# Patient Record
Sex: Female | Born: 1980 | Race: White | Hispanic: No | Marital: Married | Smoking: Never smoker
Health system: Southern US, Community
[De-identification: ages and names within clinical notes are randomized; demographics above are authoritative.]

## PROBLEM LIST (undated history)

## (undated) DIAGNOSIS — M1990 Unspecified osteoarthritis, unspecified site: Secondary | ICD-10-CM

## (undated) DIAGNOSIS — I10 Essential (primary) hypertension: Secondary | ICD-10-CM

## (undated) HISTORY — PX: COSMETIC SURGERY: SHX468

---

## 2012-08-18 HISTORY — PX: COSMETIC SURGERY: SHX468

## 2015-09-19 DIAGNOSIS — J189 Pneumonia, unspecified organism: Secondary | ICD-10-CM

## 2015-09-19 HISTORY — DX: Pneumonia, unspecified organism: J18.9

## 2017-01-23 ENCOUNTER — Emergency Department (HOSPITAL_COMMUNITY): Payer: Managed Care, Other (non HMO)

## 2017-01-23 ENCOUNTER — Encounter (HOSPITAL_COMMUNITY): Payer: Self-pay | Admitting: Emergency Medicine

## 2017-01-23 ENCOUNTER — Emergency Department (HOSPITAL_COMMUNITY)
Admission: EM | Admit: 2017-01-23 | Discharge: 2017-01-23 | Disposition: A | Discharge status: Home / Self Care | Payer: Managed Care, Other (non HMO) | Attending: Emergency Medicine | Admitting: Emergency Medicine

## 2017-01-23 DIAGNOSIS — R109 Unspecified abdominal pain: Secondary | ICD-10-CM | POA: Insufficient documentation

## 2017-01-23 DIAGNOSIS — Y939 Activity, unspecified: Secondary | ICD-10-CM | POA: Insufficient documentation

## 2017-01-23 DIAGNOSIS — S299XXA Unspecified injury of thorax, initial encounter: Secondary | ICD-10-CM | POA: Diagnosis present

## 2017-01-23 DIAGNOSIS — S2242XA Multiple fractures of ribs, left side, initial encounter for closed fracture: Secondary | ICD-10-CM | POA: Insufficient documentation

## 2017-01-23 DIAGNOSIS — Y92009 Unspecified place in unspecified non-institutional (private) residence as the place of occurrence of the external cause: Secondary | ICD-10-CM | POA: Diagnosis not present

## 2017-01-23 DIAGNOSIS — W1789XA Other fall from one level to another, initial encounter: Secondary | ICD-10-CM | POA: Diagnosis not present

## 2017-01-23 DIAGNOSIS — S301XXA Contusion of abdominal wall, initial encounter: Secondary | ICD-10-CM | POA: Insufficient documentation

## 2017-01-23 DIAGNOSIS — Y999 Unspecified external cause status: Secondary | ICD-10-CM | POA: Diagnosis not present

## 2017-01-23 DIAGNOSIS — W19XXXA Unspecified fall, initial encounter: Secondary | ICD-10-CM

## 2017-01-23 LAB — CBC WITH DIFFERENTIAL/PLATELET
Basophils Absolute: 0.1 10*3/uL (ref 0.0–0.1)
Basophils Relative: 1 %
EOS ABS: 0.1 10*3/uL (ref 0.0–0.7)
EOS PCT: 1 %
HCT: 38.3 % (ref 36.0–46.0)
Hemoglobin: 12.5 g/dL (ref 12.0–15.0)
LYMPHS ABS: 1.5 10*3/uL (ref 0.7–4.0)
Lymphocytes Relative: 16 %
MCH: 30 pg (ref 26.0–34.0)
MCHC: 32.6 g/dL (ref 30.0–36.0)
MCV: 91.8 fL (ref 78.0–100.0)
Monocytes Absolute: 0.5 10*3/uL (ref 0.1–1.0)
Monocytes Relative: 6 %
Neutro Abs: 7.5 10*3/uL (ref 1.7–7.7)
Neutrophils Relative %: 76 %
PLATELETS: 255 10*3/uL (ref 150–400)
RBC: 4.17 MIL/uL (ref 3.87–5.11)
RDW: 12.5 % (ref 11.5–15.5)
WBC: 9.7 10*3/uL (ref 4.0–10.5)

## 2017-01-23 LAB — COMPREHENSIVE METABOLIC PANEL
ALT: 18 U/L (ref 14–54)
ANION GAP: 9 (ref 5–15)
AST: 25 U/L (ref 15–41)
Albumin: 4.3 g/dL (ref 3.5–5.0)
Alkaline Phosphatase: 33 U/L — ABNORMAL LOW (ref 38–126)
BUN: 14 mg/dL (ref 6–20)
CHLORIDE: 105 mmol/L (ref 101–111)
CO2: 25 mmol/L (ref 22–32)
CREATININE: 0.84 mg/dL (ref 0.44–1.00)
Calcium: 9.4 mg/dL (ref 8.9–10.3)
GFR calc non Af Amer: >60 mL/min (ref >60)
Glucose, Bld: 104 mg/dL — ABNORMAL HIGH (ref 65–99)
Potassium: 4 mmol/L (ref 3.5–5.1)
SODIUM: 139 mmol/L (ref 135–145)
Total Bilirubin: 1.1 mg/dL (ref 0.3–1.2)
Total Protein: 6.9 g/dL (ref 6.5–8.1)

## 2017-01-23 LAB — I-STAT BETA HCG BLOOD, ED (MC, WL, AP ONLY): I-stat hCG, quantitative: <5 m[IU]/mL (ref <5)

## 2017-01-23 MED ORDER — ONDANSETRON HCL 4 MG/2ML IJ SOLN
4.0000 mg | Freq: Once | INTRAMUSCULAR | Status: AC
  Administered 2017-01-23: 4 mg via INTRAVENOUS
  Filled 2017-01-23: qty 2

## 2017-01-23 MED ORDER — MORPHINE SULFATE (PF) 4 MG/ML IV SOLN
4.0000 mg | Freq: Once | INTRAVENOUS | Status: AC
Start: 2017-01-23 — End: 2017-01-23
  Administered 2017-01-23: 4 mg via INTRAVENOUS
  Filled 2017-01-23: qty 1

## 2017-01-23 MED ORDER — OXYCODONE-ACETAMINOPHEN 5-325 MG PO TABS
1.0000 | ORAL_TABLET | ORAL | Status: DC | PRN
  Administered 2017-01-23: 1 via ORAL
  Filled 2017-01-23: qty 1

## 2017-01-23 MED ORDER — OXYCODONE-ACETAMINOPHEN 5-325 MG PO TABS
ORAL_TABLET | ORAL | Status: AC
  Filled 2017-01-23: qty 1

## 2017-01-23 MED ORDER — IOPAMIDOL (ISOVUE-300) INJECTION 61%
INTRAVENOUS | Status: AC
  Administered 2017-01-23: 100 mL
  Filled 2017-01-23: qty 100

## 2017-01-23 MED ORDER — HYDROCODONE-ACETAMINOPHEN 5-325 MG PO TABS: 1.0000 | ORAL_TABLET | Freq: Four times a day (QID) | ORAL | 0 refills | Status: AC | PRN

## 2017-01-23 NOTE — ED Provider Notes (Signed)
[facility]PT Provider Note   CSN: [id number] Arrival date & time: 01/23/17  1743     History   Chief Complaint Chief Complaint  Patient presents with  . Fall    HPI Greer PickerelKelly Ealy is a 36 y.o. female.  Patient is a 36 year old female who presents with complaints of left flank pain. She was getting something off of a shelf in the attic of her house when she stepped between the floor joists and fell partially through the ceiling. She landed on her left flank on one of the floor joists. She is complaining of pain in her left flank. She was initially evaluated at urgent care, then sent here for further evaluation. She denies any hematuria.   The history is provided by the patient.  Fall  This is a new problem. The current episode started 1 to 2 hours ago. The problem occurs constantly. The problem has not changed since onset.Associated symptoms include abdominal pain. Pertinent negatives include no chest pain. Exacerbated by: Movement and palpation. Nothing relieves the symptoms. Treatments tried: Percocet. The treatment provided no relief.    History reviewed. No pertinent past medical history.  There are no active problems to display for this patient.   History reviewed. No pertinent surgical history.  OB History    Gravida Para Term Preterm AB Living   1             SAB TAB Ectopic Multiple Live Births                   Home Medications    Prior to Admission medications   Not on File    Family History No family history on file.  Social History Social History  Substance Use Topics  . Smoking status: Never Smoker  . Smokeless tobacco: Never Used  . Alcohol use No     Allergies   Patient has no allergy information on record.   Review of Systems Review of Systems  Cardiovascular: Negative for chest pain.  Gastrointestinal: Positive for abdominal pain.  All other systems reviewed and are negative.    Physical Exam Updated Vital Signs BP (!) 137/93  (BP Location: Left Arm)   Pulse (!) 108   Temp 98.7 F (37.1 C) (Oral)   Resp 18   Ht 5\' 5"  (1.651 m)   Wt 112 lb (50.8 kg)   LMP 01/08/2017 (Approximate)   SpO2 100%   BMI 18.64 kg/m   Physical Exam  Constitutional: She is oriented to person, place, and time. She appears well-developed and well-nourished. No distress.  HENT:  Head: Normocephalic and atraumatic.  Neck: Normal range of motion. Neck supple.  Cardiovascular: Normal rate and regular rhythm.  Exam reveals no gallop and no friction rub.   No murmur heard. Pulmonary/Chest: Effort normal and breath sounds normal. No respiratory distress. She has no wheezes.  Breath sounds are clear and equal.  Abdominal: Soft. Bowel sounds are normal. She exhibits no distension. There is no tenderness.  There is a contusion/abrasion to the left flank. There is no tenderness in the lumbar or thoracic spine.   Musculoskeletal: Normal range of motion.  Neurological: She is alert and oriented to person, place, and time.  Skin: Skin is warm and dry. She is not diaphoretic.  Nursing note and vitals reviewed.    ED Treatments / Results  Labs (all labs ordered are listed, but only abnormal results are displayed) Labs Reviewed  COMPREHENSIVE METABOLIC PANEL - Abnormal; Notable for the following:  Result Value   Glucose, Bld 104 (*)    Alkaline Phosphatase 33 (*)    All other components within normal limits  CBC WITH DIFFERENTIAL/PLATELET  URINALYSIS, ROUTINE W REFLEX MICROSCOPIC  HCG, SERUM, QUALITATIVE    EKG  EKG Interpretation None       Radiology No results found.  Procedures Procedures (including critical care time)  Medications Ordered in ED Medications  oxyCODONE-acetaminophen (PERCOCET/ROXICET) 5-325 MG per tablet 1 tablet (1 tablet Oral Given 01/23/17 1926)  morphine 4 MG/ML injection 4 mg (not administered)     Initial Impression / Assessment and Plan / ED Course  I have reviewed the triage vital signs and  the nursing notes.  Pertinent labs & imaging results that were available during my care of the patient were reviewed by me and considered in my medical decision making (see chart for details).  Patient sent from urgent care for evaluation after a fall through an attic floor joist. She is complaining of pain in her left flank. CT scan shows no evidence for internal injury to the kidney or spleen. It does however show what appears to be nondisplaced fractures of the 10th and 11th ribs. This will be treated with pain medicine and follow-up as needed.  Final Clinical Impressions(s) / ED Diagnoses   Final diagnoses:  None    New Prescriptions New Prescriptions   No medications on file     Geoffery Lyons, MD 01/23/17 2322

## 2017-01-23 NOTE — ED Notes (Signed)
Pt left POV with family members, in stable condition. Left with discharge instructions and prescriptions in hand.

## 2017-01-23 NOTE — ED Triage Notes (Signed)
[facility] falling through attic of home. Pt did not fall through completely, one foot fell through and patient landed on left abdomen/side. Pt [facility] and did not show any rib fractures however provider was concerned for patients spleen as she is tender to LUQ of abdomen and left axilla.

## 2017-01-23 NOTE — Discharge Instructions (Signed)
Hydrocodone as prescribed as needed for pain.  Return to the emergency department for difficulty breathing, high fevers, productive cough, or other new and concerning symptoms.

## 2018-03-15 IMAGING — CT CT ABD-PELV W/ CM
2 of 5 series · 16 of 46 positions shown, 18 images · IV contrast (APPLIED)
Comparison: None.

CLINICAL DATA: 35 y/o  F; left upper quadrant pain after fall.

EXAM:
CT ABDOMEN AND PELVIS WITH CONTRAST
TECHNIQUE: Multidetector CT imaging of the abdomen and pelvis was performed
using the standard protocol following bolus administration of
intravenous contrast.
CONTRAST:  100mL 3MFZA2-QDD IOPAMIDOL (3MFZA2-QDD) INJECTION 61%

[Series 3: abd/ pelvis 5.0 i30f 2 · axial · 0.69mm/px · z∈[+734,+1139]mm · 13 of 91 slices shown, 15 images]
[im 5/91  soft-tissue]
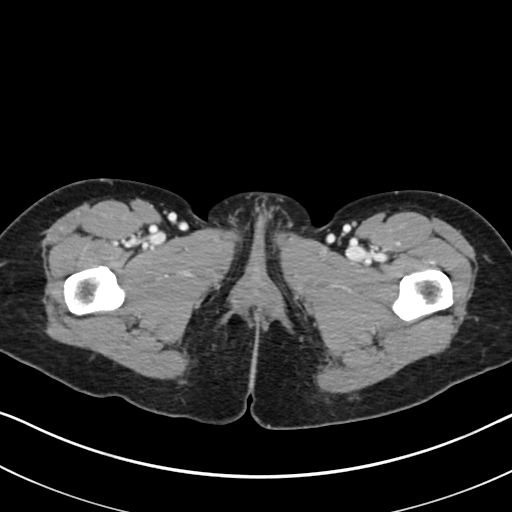
[im 5/91  bone]
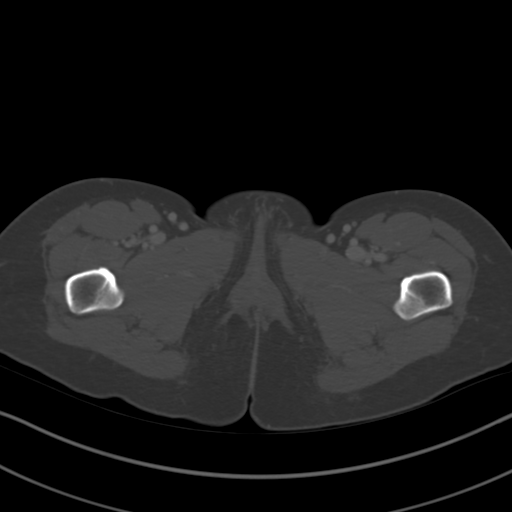
[im 14/91  soft-tissue]
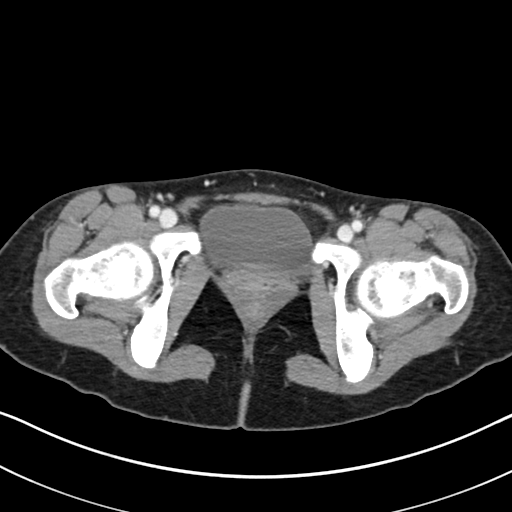
[im 19/91  soft-tissue]
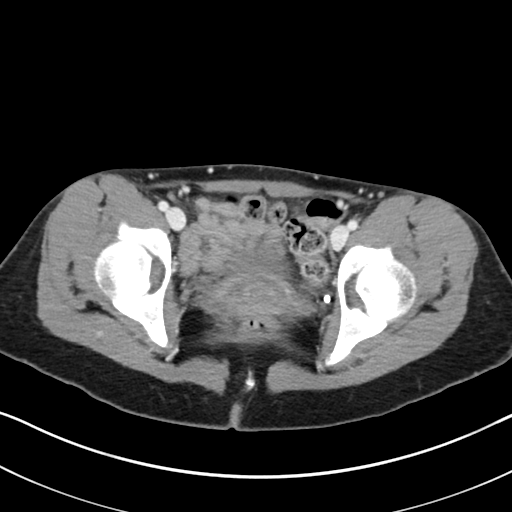
[im 28/91  soft-tissue]
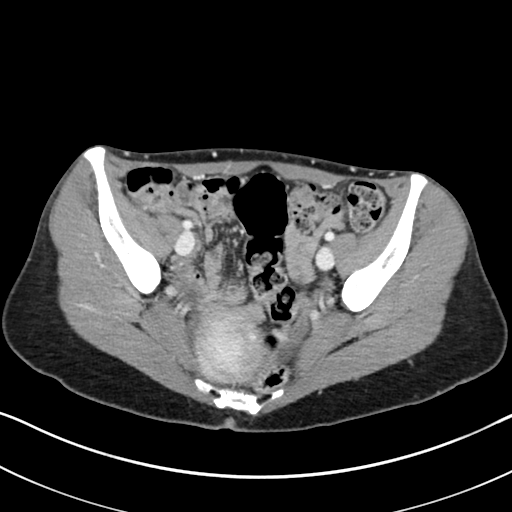
[im 32/91  soft-tissue]
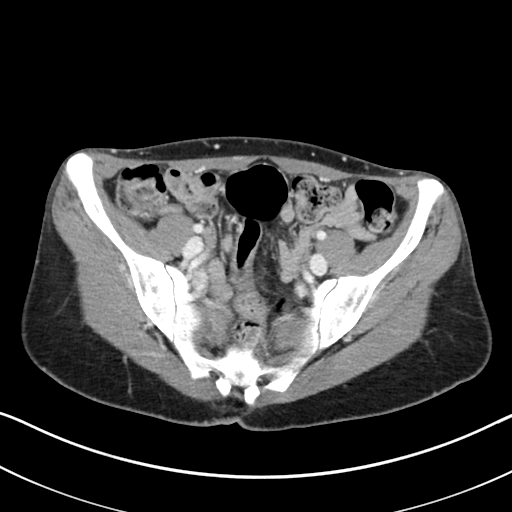
[im 41/91  soft-tissue]
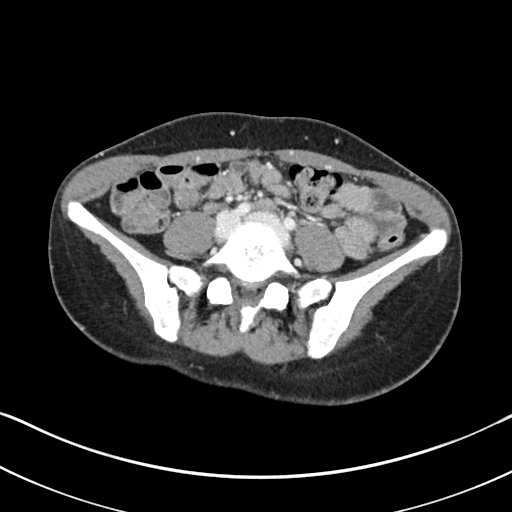
[im 46/91  soft-tissue]
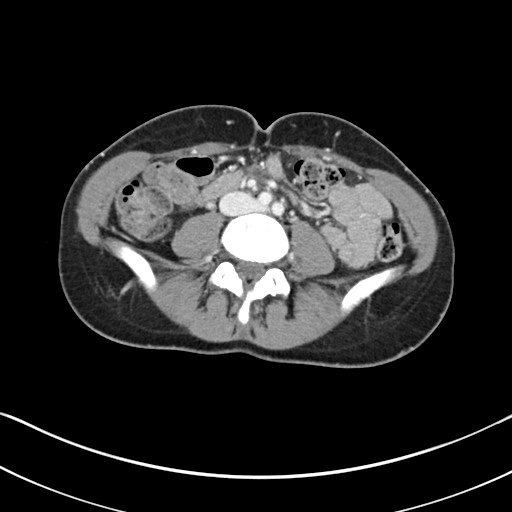
[im 50/91  soft-tissue]
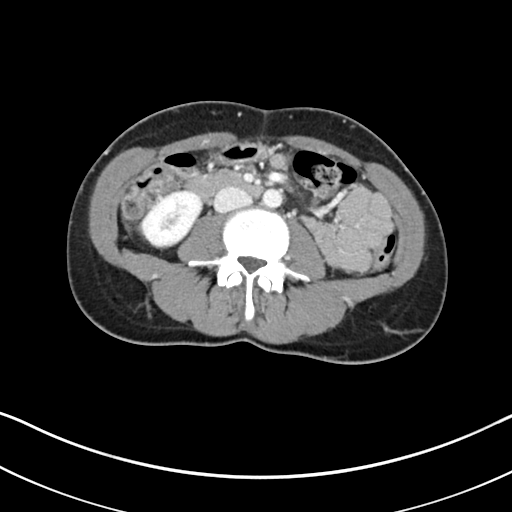
[im 59/91  soft-tissue]
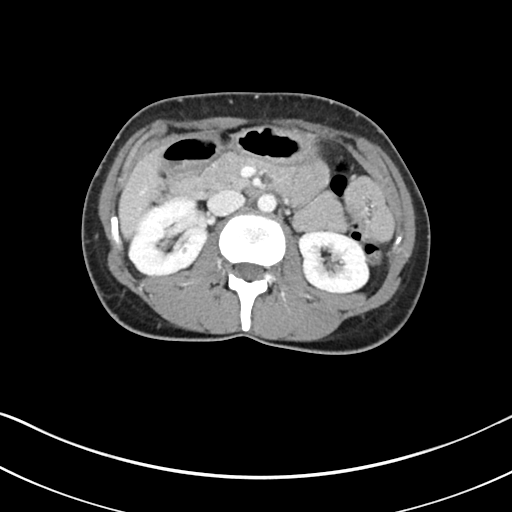
[im 59/91  bone]
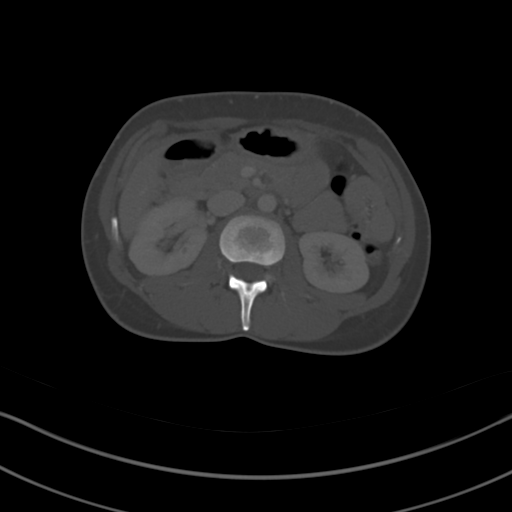
[im 64/91  soft-tissue]
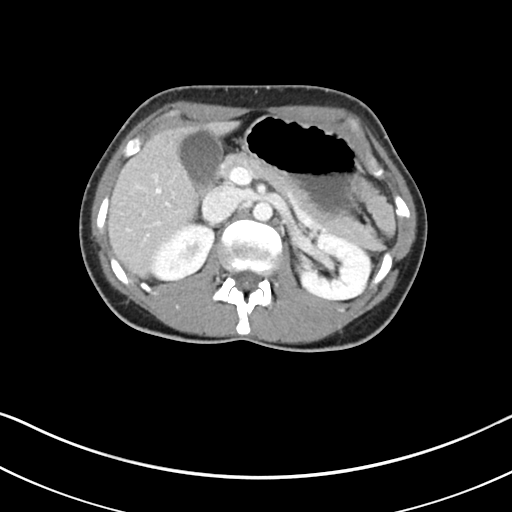
[im 73/91  soft-tissue]
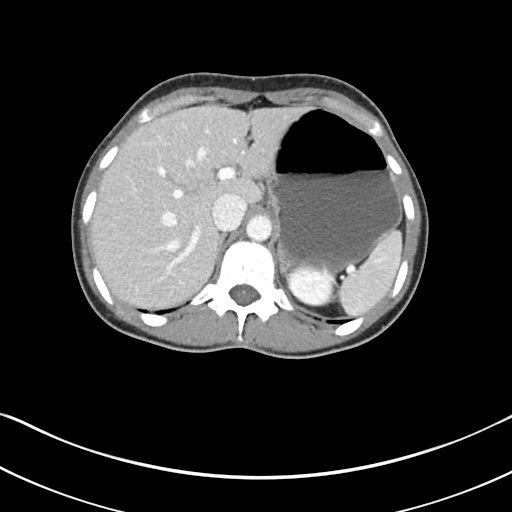
[im 77/91  soft-tissue]
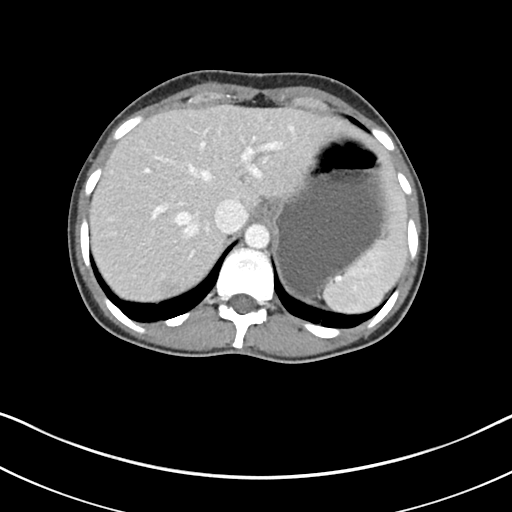
[im 86/91  soft-tissue]
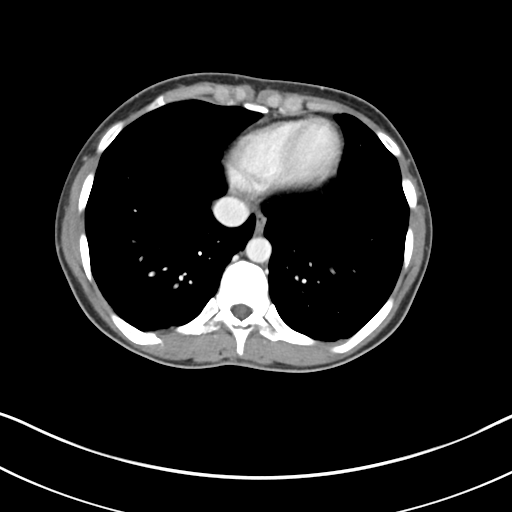

[Series 6: coronal soft tissue · coronal · 0.70mm/px · 3 of 84 slices shown]
[im 28/84  soft-tissue]
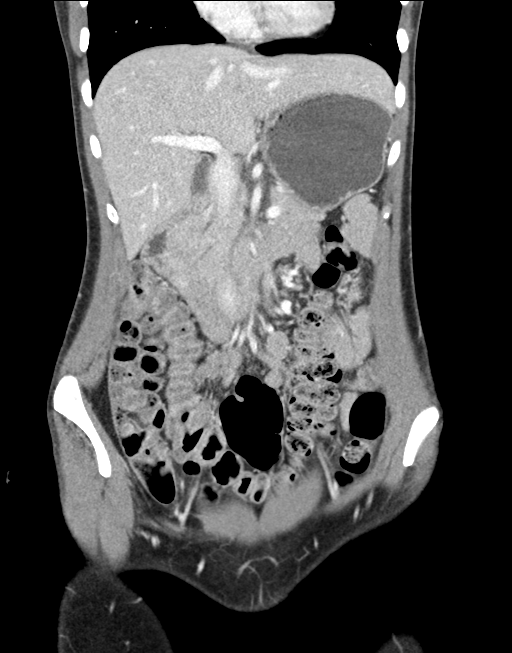
[im 37/84  soft-tissue]
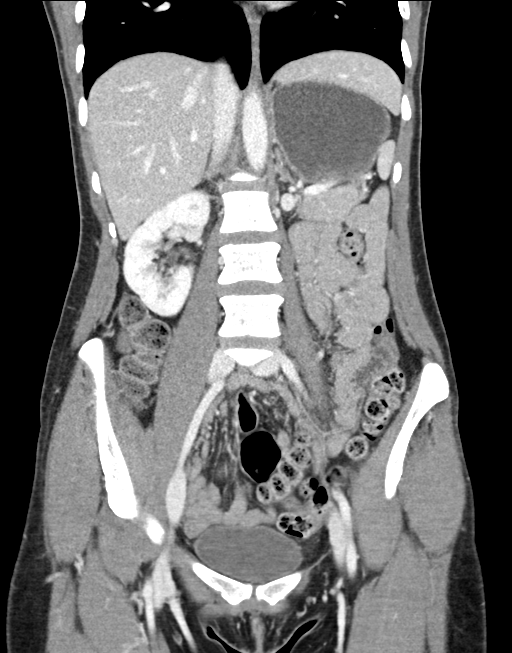
[im 47/84  soft-tissue]
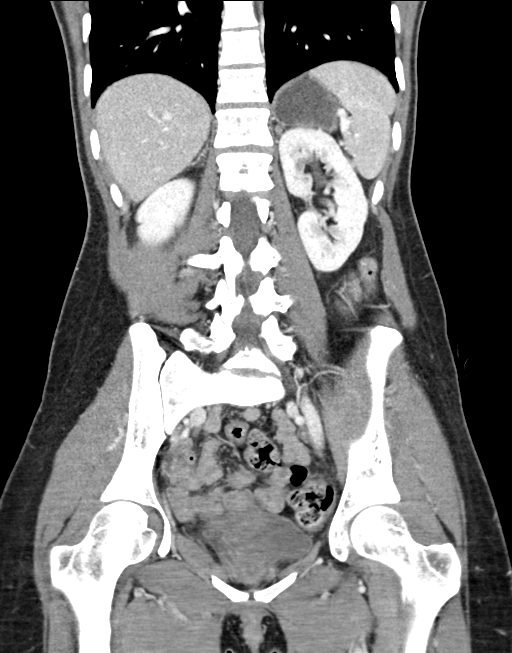

[16 of 46 positions shown; findings below may reference images not displayed]

FINDINGS: Lower chest: No acute abnormality.

Hepatobiliary: No hepatic injury or perihepatic hematoma.
Gallbladder is unremarkable

Pancreas: Unremarkable. No pancreatic ductal dilatation or
surrounding inflammatory changes.

Spleen: No splenic injury or perisplenic hematoma.

Adrenals/Urinary Tract: No adrenal hemorrhage or renal injury
identified. Bladder is unremarkable. Left kidney lower pole punctate
nonobstructing stone.

Stomach/Bowel: Stomach is within normal limits. Appendix appears
normal. No evidence of bowel wall thickening, distention, or
inflammatory changes.

Vascular/Lymphatic: No significant vascular findings are present. No
enlarged abdominal or pelvic lymph nodes.

Reproductive: Uterus and bilateral adnexa are unremarkable.

Other: No abdominal wall hernia or abnormality. No abdominopelvic
ascites.

Musculoskeletal: 1 shaft width displaced lateral left tenth and
eleventh rib acute fractures. No other fracture identified.
IMPRESSION: 1. 1 shaft width displaced lateral left tenth and eleventh rib acute
fractures. No other fracture identified.
2. No acute internal injury.
3. Left kidney lower pole nonobstructing punctate stone.

By: Kadriya Nalbandyan M.D.

## 2018-09-16 ENCOUNTER — Ambulatory Visit (INDEPENDENT_AMBULATORY_CARE_PROVIDER_SITE_OTHER): Admitting: Family

## 2018-09-16 ENCOUNTER — Encounter (INDEPENDENT_AMBULATORY_CARE_PROVIDER_SITE_OTHER): Payer: Self-pay | Admitting: Family

## 2018-09-16 VITALS — BP 127/89 | HR 72 | Temp 98.7°F | Ht 69.0 in | Wt 157.0 lb

## 2018-09-16 DIAGNOSIS — J320 Chronic maxillary sinusitis: Principal | ICD-10-CM

## 2018-09-16 DIAGNOSIS — Z87891 Personal history of nicotine dependence: Secondary | ICD-10-CM

## 2018-09-16 MED ORDER — METHYLPREDNISOLONE 4 MG TABLETS IN A DOSE PACK
ORAL_TABLET | ORAL | 0 refills | Status: DC
Start: 2018-09-16 — End: 2024-08-11

## 2018-09-16 MED ORDER — AMOXICILLIN 875 MG-POTASSIUM CLAVULANATE 125 MG TABLET
1.0000 | ORAL_TABLET | Freq: Two times a day (BID) | ORAL | 0 refills | Status: DC
Start: 2018-09-16 — End: 2024-08-11

## 2018-09-16 NOTE — Progress Notes (Signed)
[address]  [address]  [place] [id number]            Name: Morgan Evans Glover MRN:  [id number]   Date: 09/16/2018 Age: 37 y.o.         09/16/2018    CHIEF COMPLAINT:Congestion and Cough    SUBJECTIVE:  Morgan Evans Herdt is a 37 y.o. female who presents to urgent care with complaint of 9 days of nasal sinus congestion, intermittent fevers, and cough that is worst in the morning when waking up.  The patient reports that she cough throughout the day but is usually dry.  The last few days she has been coughing up thick green/yellow mucus.  The patient is a history of pneumonia 3-4 years ago where she was hospitalized for 4-5 days.  Her main concern is that this illness may be progressing in that direction.  The patient states that she was starting to feel better on days 3 and 4, and then started getting worse again.  She denies definitive fevers, but states she does get chills periodically specifically at night.  Reports a history of childhood asthma, but has not had any chronic respiratory issues as an adult.    Review of Systems   Pertinent items are noted in HPI. All pertinent positives and negatives noted in the HPI  Constitutional: no fever, no chills.  Neuro: No dizziness.  Eyes: No blurring of vision or diplopia  ENT:  No tinnitus, No rhinorrhea, + sinus pains.  Respiratory:  Frequent cough, No shortness of breath, No wheezing.  Cardiovascular: no chest pain  No palpitation.  Muskuloskeletal: No myalgias, No arthralgias.  Skin: no  rashes.    Social History     Tobacco Use   . Smoking status: Former Games developermoker   . Smokeless tobacco: Never Used   Substance Use Topics   . Alcohol use: Yes     Frequency: Monthly or less   . Drug use: Never     Allergy History as of 09/16/18      No Known Allergies              Family Medical History:     Problem Relation (Age of Onset)    COPD Father    Diabetes Father          History of pneumonia 3-4 days ago that required hospitalization.    There is no problem  list on file for this patient.    cholecalciferol, vitamin D3, 2,000 unit Oral Tablet, Take 2,000 Units by mouth Once a day VITAMIN d  multivitamin Oral Tablet, Take 1 Tab by mouth Once a day    No facility-administered medications prior to visit.       OBJECTIVE:  Physical Exam:  BP 127/89   Pulse 72   Temp 37.1 C (98.7 F) (Oral)   Ht 1.753 m (5\' 9" )   Wt 71.2 kg (157 lb)   SpO2 98%   BMI 23.18 kg/m       General Appearance: pleasant,  in no acute distress  HEENT: PERLA, EOMI.  Posterior oropharynx erythematous.  No tonsillar enlargement or exudate bilaterally, mucous membranes moist.  Maxillary and frontal sinuses tender to palpation.  Bilateral TMs with serous effusion; without erythema.  Generalized rhinitis. No cervical adenopathy.   Heart: RRR, no murmur.  Lungs:  Coarse to auscultation bilaterally.  Equal rise and fall.  No wheezing, rales, or rhonchi.  Skin: No rashes on exposed areas of skin.  Frequent cough  throughout exam.  CNS: A & O x 3, no meningeal signs noted.    ASSESSMENT:  Maxillary sinusitis      PLAN:  Orders Placed This Encounter   . amoxicillin-pot clavulanate (AUGMENTIN) 875-125 mg Oral Tablet   . Methylprednisolone (MEDROL DOSEPACK) 4 mg Oral Tablets, Dose Pack      Given augmentin X 10 days, advised nettipot, encouraged oral hydration, OTC antihistamine, NSAID's PRN with food with dose reviewed for pain. Antibiotics as above, with warnings including allergic reaction, and C. Difficile, both of which can be severe. Patient advised these type of reactions are unpredictable.  Yogurt/probiotics prophylaxis discussed.  Provided patient a Medrol Dosepak due to lung sounds pain course and we discussed that this could be an early bronchitis.    Patient plans to travel home to [place] on [place]/2018 and will follow up with primary care shortly after returning.    Advised to follow up with PCP in 10 days if symptoms not better.    Patient/Family verbalizes understanding of the treatment plan  and all questions are answered prior to departure.    Portions of this note may be dictated using voice recognition software . Variances in spelling and vocabulary are possible and unintentional. Not all errors are caught/corrected. Please notify the [facility] if any discrepancies are noted or if the meaning of any statement is not clear.   Terisa StarrJohn T Considine, DNP,FNP-C  09/16/2018, 08:49    Supervising Physician:  Dr. Freeman Caldronajah

## 2018-09-16 NOTE — Patient Instructions (Addendum)
[address], [facility]  [address]  [place] [address]  Phone: [phone]  Fax: [phone]           Open Daily 8:00am - 8:00pm, except Sundays 12pm-8pm         ~ Closed Thanksgiving and Christmas Day     Attending Caregiver: Madie Reno Considine, DNP,FNP-C    Today's orders: No orders of the defined types were placed in this encounter.       Prescription(s) E-Rx to:  [facility] [place]    ________________________________________________________________________  Short Term Disability and [facility] does NOT provide assistance with any disability applications.  If you feel your medical condition requires you to be on disability, you will need to follow up with  Your primary care physician or a specialist.  We apologize for any inconvenience.    For Medication Prescribed by [facility]:  As an Urgent Care facility, our clinic does NOT offer prescription refills over the telephone.    If you need more of the medication one of our medical providers prescribed, you will  Either need to be re-evaluated by [place] or see your primary care physician.    ________________________________________________________________________      It is very important that we have a phone number that is the single best way to contact you in the event that we become aware of important clinical information or concerns after your discharge.  If the phone number you provided at registration is NOT this number you should inform staff and registration prior to leaving.      Your treatment and evaluation today was focused on identifying and treating potentially emergent conditions based on your presenting signs, symptoms, and history.  The resulting initial clinical impression and treatment plan is not intended to be definitive or a substitute for a full physical examination and evaluation by your primary  care provider.  If your symptoms persist, worsen, or you develop any new or concerning symptoms, you need to be evaluated.      If you received x-rays during your visit, be aware that the final and formal interpretation of those films by a radiologist may occur after your discharge.  If there is a significant discrepancy identified after your discharge, we will contact you at the telephone number provided at registration.      If you received a pelvic exam, you may have cultures pending for sexually transmitted diseases.  Positive cultures are reported to the [place] Department of Health as required by state law.  You should be contacted if you cultures are positive.  We will not contact you if they are negative.  You did NOT receive a PAP smear (the screening test for cervical).  This specific test for women is best performed by your gynecologist or primary care provider when indicated.      If you are over 70 year old, we cannot discuss your personal health information with a parent, spouse, family member, or anyone else without your express consent.  This does not include those who have legitimate access to your records and information to assist in your care under the provisions of HIPAA ([place]) law, or those to whom you have previously given express written consent to do so, such a legal guardian or Power of [place].      You may  have received medication that may cause you to feel drowsy and/or light headed for several hours.  You may even experience some amnesia of your stay.  You should avoid operating a motor vehicle or performing any activity requiring complete alertness or coordination until you feel fully awake (approximately 24-48 hours).  Avoid alcoholic beverages.  You may also have a dry mouth for several hours.  This is a normal side effect and will disappear as the effects of the medication wear off.      Instructions discussed with patient upon discharge by  clinical staff with all questions answered.  Please call [facility] [phone] if any further questions.  Go immediately to the emergency department if any concern or worsening symptoms.    Jackquline DenmarkJohn T Considine, DNP,FNP-C 09/16/2018, 08:49          Sinusitis (Antibiotic Treatment)    The sinuses are air-filled spaces within the bones of the face. They connect to the inside of the nose.Sinusitisis an inflammation of the tissue that lines the sinuses. Sinusitis can occur during a cold. It can also happen due to allergies to pollens and other particles in the air. Sinusitis can cause symptoms of sinus congestion and a feeling of fullness. A sinus infection causes fever, headache, and facial pain. There is often green or yellow fluid draining from the nose or into the back of the throat (post-nasal drip). You have been given antibiotics to treat this condition.  Home care   Take the full course of antibiotics as instructed. Do not stop taking them, even when you feel better.   Drink plenty of water, hot tea, and other liquids. This may help thin nasal mucus. It also may help your sinuses drain fluids.   Heat may help soothe painful areas of your face. Use a towel soaked in hot water. Or, stand in the shower and direct the warm spray onto your face. Using a vaporizer along with a menthol rub at night may also help soothe symptoms.   Anexpectorantwith guaifenesin may help thin nasal mucus and help your sinuses drain fluids.   You can use an over-the-counterdecongestant,unless a similar medicine was prescribed to you. Nasal sprays work the fastest. Use one that contains phenylephrine or oxymetazoline. First blow your nose gently. Then use the spray. Do not use these medicines more often than directed on the label. If you do, your symptoms may get worse. You may also take pills that contain pseudoephedrine. Don't use products that combine multiple medicines. This is because side effects may be increased. Read  labels. You can also ask the pharmacist for help. (People with high blood pressure should not use decongestants. They can raise blood pressure.)   Over-the-counterantihistaminesmay help if allergies contributed to your sinusitis.    Do not use nasal rinses or irrigation during an acute sinus infection, unless your healthcare provider tells you to. Rinsing may spread the infection to other areas in your sinuses.   Use acetaminophen or ibuprofen to control pain, unless another pain medicine was prescribed to you. If you have chronic liver or kidney disease or ever had a stomach ulcer, talk with your healthcare provider before using these medicines. (Aspirin should never be taken by anyone under age 39 who is ill with a fever. It may cause severe liver damage.)   Don't smoke. This can make symptoms worse.  Follow-up care  Follow up with your healthcare provider or our staff if you are not better in 1 week.  When to  seek medical advice  Call your healthcare provider if any of these occur:   Facial pain or headache that gets worse   Stiff neck   Unusual drowsiness or confusion   Swelling of your forehead or eyelids   Vision problems, such as blurred or double vision   Fever of100.65F (38C)or higher, or as directed by your healthcare provider   Seizure   Breathing problems   Symptoms don't go away in 10 days  Prevention  Here are steps you can take to help prevent an infection:   Keep good hand washing habits.   Don't have close contact with people who have sore throats, colds, or other upper respiratory infections.   Don't smoke, and stay away from secondhand smoke.   Stay up to date with of your vaccines.  [employer] last reviewed this educational content on 07/19/2016   2000-2019 [employer]. [address] [place] [address]. All rights reserved. This information is not intended as a substitute for professional medical care. Always follow your healthcare professional's  instructions.

## 2018-09-16 NOTE — Nursing Note (Signed)
BP 127/89   Pulse 72   Temp 37.1 C (98.7 F) (Oral)   Ht 1.753 m (5\' 9" )   Wt 71.2 kg (157 lb)   SpO2 98%   BMI 23.18 kg/m     [facility] Burdickeather Dawn SeboyetaSell, [place]  09/16/2018, 08:21

## 2022-07-19 HISTORY — PX: MANDIBLE SURGERY: SHX707

## 2024-06-04 ENCOUNTER — Telehealth (HOSPITAL_BASED_OUTPATIENT_CLINIC_OR_DEPARTMENT_OTHER): Payer: Self-pay

## 2024-06-04 NOTE — Telephone Encounter (Signed)
 LVM for Patient to call and schedule an appointment.  Referral scanned into MM

## 2024-07-14 ENCOUNTER — Ambulatory Visit (HOSPITAL_BASED_OUTPATIENT_CLINIC_OR_DEPARTMENT_OTHER): Payer: Self-pay

## 2024-07-16 ENCOUNTER — Other Ambulatory Visit: Payer: Self-pay

## 2024-07-16 ENCOUNTER — Encounter (HOSPITAL_BASED_OUTPATIENT_CLINIC_OR_DEPARTMENT_OTHER): Payer: Self-pay

## 2024-07-16 ENCOUNTER — Ambulatory Visit: Payer: Self-pay

## 2024-07-16 VITALS — BP 132/88 | HR 74 | Temp 97.7°F | Ht 69.0 in | Wt 164.6 lb

## 2024-07-16 DIAGNOSIS — Z1211 Encounter for screening for malignant neoplasm of colon: Secondary | ICD-10-CM | POA: Insufficient documentation

## 2024-07-16 NOTE — H&P (Signed)
 GASTROENTEROLOGY, MEDICAL OFFICE BUILDING 3  [address]  [place] [address]  Operated by [facility]     Consult Note    Name: Morgan Evans MRN:  [id number]   Date: 07/16/2024 Age: 43 y.o. 1981-01-14        Referring provider: KATHEE Juliene Byers, MD     Chief Complaint:   Chief Complaint              New Patient     Colon Cancer Screening CONSULTATION              History of Present Illness:  Morgan Evans is a 43 y.o. female presents today for cancer screening.  She has never had a colonoscopy.  Weight has been stable. Appetite is good without concerns of nausea or vomiting. Patient denies dysphagia, pyrosis, abdominal pain, constipation, diarrhea, melena, or hematochezia.   No family history of any GI malignancy.  She is not taking ASA or blood thinners.   Patient denies tobacco use.       Past Medical History  Current Outpatient Medications   Medication Sig    amoxicillin -pot clavulanate (AUGMENTIN ) 875-125 mg Oral Tablet Take 1 Tab by mouth Every 12 hours (Patient not taking: Reported on 07/16/2024)    cholecalciferol, vitamin D3, 2,000 unit Oral Tablet Take 2,000 Units by mouth Once a day VITAMIN d    lisinopriL (PRINIVIL) 2.5 mg Oral Tablet TAKE 1 TABLET BY MOUTH EVERY EVENING FOR BLOOD PRESSURE    magnesium chloride (SLOW-MAG) 64 mg Oral Tablet, Delayed Release (E.C.) Take 1 Tablet (64 mg total) by mouth    Methylprednisolone  (MEDROL  DOSEPACK) 4 mg Oral Tablets, Dose Pack Take as instructed. (Patient not taking: Reported on 07/16/2024)    multivitamin Oral Tablet Take 1 Tab by mouth Once a day     Allergies[1]  Past Medical History:   Diagnosis Date    Pneumonia due to infectious organism 2017         Past Surgical History:   Procedure Laterality Date    COSMETIC SURGERY           Family Medical History:       Problem Relation (Age of Onset)    COPD Father    Diabetes Father            Social History     Socioeconomic History    Marital status: Married   Tobacco Use    Smoking status: Former     Smokeless tobacco: Never   Substance and Sexual Activity    Alcohol use: Yes     Comment: RARE    Drug use: Never       Review of Systems:  All pertinent ROS as addressed and detailed in HPI      Physical Exam:  BP 132/88   Pulse 74   Temp 36.5 C (97.7 F)   Ht 1.753 m (5' 9)   Wt 74.7 kg (164 lb 9.6 oz)   BMI 24.31 kg/m       Physical Exam  Constitutional:       General: She is not in acute distress.     Appearance: Normal appearance.   HENT:      Mouth/Throat:      Mouth: Mucous membranes are moist.   Cardiovascular:      Rate and Rhythm: Normal rate and regular rhythm.      Heart sounds: Normal heart sounds.   Pulmonary:      Effort: Pulmonary  effort is normal.      Breath sounds: Normal breath sounds.   Abdominal:      General: Abdomen is flat. Bowel sounds are normal.      Palpations: Abdomen is soft.   Skin:     General: Skin is warm and dry.   Neurological:      Mental Status: She is alert and oriented to person, place, and time.          No visits with results within 3 Month(s) from this visit.   Latest known visit with results is:   No results found for any previous visit.       Assessment and Plan:  Morgan Evans is a 43 y.o.female who was seen for colon cancer screening.  Given that patient has not yet had a screening colonoscopy, we will proceed with scheduling.  Prep instructions provided to patient and orders for lab work placed. Discussed the risks associated with a colonoscopy that include infection, bleeding, possible bowel perforation which would require hospitalization and possible surgery, and possible splenic rupture.  Patient is aware of the risks and wishes to proceed with the colonoscopy.     On the day of the encounter, a total of 30 minutes was spent on this patient encounter including review of historical information, examination, documentation and post-visit activities. The time documented excludes procedural time.     ICD-10-CM    1. Colon cancer screening  Z12.11 CBC/DIFF      COMPREHENSIVE METABOLIC PANEL, NON-FASTING          Orders Placed This Encounter    CBC/DIFF    COMPREHENSIVE METABOLIC PANEL, NON-FASTING      No follow-ups on file.    I thank B Juliene Byers, MD for allowing me to participate in [facility].    Waddell [place], FNP-C  Portions of this note may be dictated using voice recognition software.   Variances in spelling and vocabulary are possible and unintentional. Not all errors are caught/corrected. Please notify the dino if any discrepancies are noted or if the meaning of any statement is not clear.          [1] No Known Allergies

## 2024-07-17 ENCOUNTER — Encounter (HOSPITAL_BASED_OUTPATIENT_CLINIC_OR_DEPARTMENT_OTHER): Payer: Self-pay

## 2024-07-17 NOTE — Progress Notes (Signed)
 Offered them a date for procedure. Patient accepted the following:    PROC: COLON  DATE: DEC 8TH 2025  PST: NOV 24TH 2025 @1PM   LOC: [facility]  DOC: HADI     Patient to call with any questions or concerns.        Morgan Evans

## 2024-08-07 ENCOUNTER — Other Ambulatory Visit: Payer: Self-pay

## 2024-08-11 ENCOUNTER — Encounter (HOSPITAL_BASED_OUTPATIENT_CLINIC_OR_DEPARTMENT_OTHER): Payer: Self-pay

## 2024-08-11 ENCOUNTER — Telehealth (HOSPITAL_BASED_OUTPATIENT_CLINIC_OR_DEPARTMENT_OTHER): Payer: Self-pay

## 2024-08-11 ENCOUNTER — Ambulatory Visit: Admission: RE | Admit: 2024-08-11 | Discharge: 2024-08-11 | Payer: Self-pay | Attending: GENERAL

## 2024-08-11 ENCOUNTER — Other Ambulatory Visit: Payer: Self-pay

## 2024-08-11 HISTORY — DX: Unspecified osteoarthritis, unspecified site: M19.90

## 2024-08-11 HISTORY — DX: Essential (primary) hypertension: I10

## 2024-08-11 NOTE — Telephone Encounter (Signed)
 Patient called in and wanted get labs faxed to [facility] but didn't have the number. Patient will be in to pick up orders either today or tomorrow.     Krysten Morgan

## 2024-08-18 LAB — CBC/DIFF
HCT: 40.9
HGB: 13.4
MCH: 33.4 — ABNORMAL HIGH
MCHC: 32.8
MCV: 102 — ABNORMAL HIGH
PLATELET COUNT: 284
RBC: 4.01
WBC: 3.3 — ABNORMAL LOW

## 2024-08-18 LAB — COMPREHENSIVE METABOLIC PANEL, NON-FASTING
ALBUMIN: 4.5
ALKALINE PHOSPHATASE: 44
ALT (SGPT): 20
AST (SGOT): 24
BILIRUBIN, TOTAL: 0.4
BUN/CREAT RATIO: 27 — ABNORMAL HIGH
BUN: 17
CALCIUM: 9.3
CARBON DIOXIDE: 21
CHLORIDE: 103
CREATININE: 0.62
ESTIMATED GLOMERULAR FILTRATION RATE: 112
GLUCOSE,NONFAST: 92
POTASSIUM: 4.7
SODIUM: 139
TOTAL PROTEIN: 6.6

## 2024-08-19 ENCOUNTER — Encounter (HOSPITAL_BASED_OUTPATIENT_CLINIC_OR_DEPARTMENT_OTHER): Payer: Self-pay

## 2024-08-19 ENCOUNTER — Other Ambulatory Visit (HOSPITAL_BASED_OUTPATIENT_CLINIC_OR_DEPARTMENT_OTHER): Payer: Self-pay

## 2024-08-19 ENCOUNTER — Ambulatory Visit (HOSPITAL_BASED_OUTPATIENT_CLINIC_OR_DEPARTMENT_OTHER): Payer: Self-pay

## 2024-08-19 DIAGNOSIS — Z1211 Encounter for screening for malignant neoplasm of colon: Secondary | ICD-10-CM

## 2024-08-22 ENCOUNTER — Telehealth (HOSPITAL_BASED_OUTPATIENT_CLINIC_OR_DEPARTMENT_OTHER): Payer: Self-pay | Admitting: GENERAL

## 2024-08-22 NOTE — Telephone Encounter (Signed)
 I called and informed patient of Mondays procedure arrival time of 7:45 am at [facility] [address].  Patient verbalized understanding.    Lindell Spar, [place]

## 2024-08-25 ENCOUNTER — Ambulatory Visit
Admission: RE | Admit: 2024-08-25 | Discharge: 2024-08-25 | Disposition: A | Discharge status: Home / Self Care | Source: Ambulatory Visit | Attending: GENERAL | Admitting: GENERAL

## 2024-08-25 ENCOUNTER — Ambulatory Visit (HOSPITAL_BASED_OUTPATIENT_CLINIC_OR_DEPARTMENT_OTHER): Admitting: Anesthesiology

## 2024-08-25 ENCOUNTER — Encounter (HOSPITAL_BASED_OUTPATIENT_CLINIC_OR_DEPARTMENT_OTHER): Payer: Self-pay | Admitting: GENERAL

## 2024-08-25 ENCOUNTER — Encounter (HOSPITAL_BASED_OUTPATIENT_CLINIC_OR_DEPARTMENT_OTHER)
Admission: RE | Disposition: A | Discharge status: Home / Self Care | Payer: Self-pay | Source: Ambulatory Visit | Attending: GENERAL

## 2024-08-25 DIAGNOSIS — K648 Other hemorrhoids: Secondary | ICD-10-CM

## 2024-08-25 DIAGNOSIS — Z1211 Encounter for screening for malignant neoplasm of colon: Secondary | ICD-10-CM | POA: Insufficient documentation

## 2024-08-25 SURGERY — COLONOSCOPY WITH BIOPSY
Anesthesia: Monitor Anesthesia Care | Site: Anus | Wound class: Clean Contaminated Wounds-The respiratory, GI, Genital, or urinary

## 2024-08-25 MED ORDER — PROPOFOL 10 MG/ML INTRAVENOUS EMULSION
INTRAVENOUS | Status: AC
  Filled 2024-08-25: qty 50

## 2024-08-25 MED ORDER — LIDOCAINE (PF) 100 MG/5 ML (2 %) INTRAVENOUS SYRINGE
INJECTION | Freq: Once | INTRAVENOUS | Status: DC | PRN
  Administered 2024-08-25: 100 mg via INTRAVENOUS

## 2024-08-25 MED ORDER — PROPOFOL 10 MG/ML IV - CHI
INTRAVENOUS | Status: DC | PRN
  Administered 2024-08-25: 0 ug/kg/min via INTRAVENOUS
  Administered 2024-08-25: 150 ug/kg/min via INTRAVENOUS

## 2024-08-25 MED ORDER — LIDOCAINE (PF) 20 MG/ML (2 %) INJECTION SOLUTION
INTRAMUSCULAR | Status: AC
  Filled 2024-08-25: qty 5

## 2024-08-25 MED ORDER — LACTATED RINGERS INTRAVENOUS SOLUTION: INTRAVENOUS | Status: DC

## 2024-08-25 MED ORDER — PROPOFOL 10 MG/ML IV BOLUS
INJECTION | Freq: Once | INTRAVENOUS | Status: DC | PRN
  Administered 2024-08-25 (×2): 50 mg via INTRAVENOUS

## 2024-08-25 SURGICAL SUPPLY — 7 items
CLIP HMST MR CONDITIONAL BRD CATH ROT CONTROL KNOB NO SHEATH RSL 360 235CM 2.8MM 11MM OPN (ENDOSCOPIC SUPPLIES) IMPLANT
FORCEPS BIOPSY LRG CPC NEEDLE 240CM 2.2MM RJ 3 DISP ORNG (ENDOSCOPIC SUPPLIES) IMPLANT
KIT ENDOS CMPLN ENDOKIT DISP COLON (MED SURG SUPPLIES) ×1 IMPLANT
PAD TRNSPT GRN 42X17IN CINCHPAD TRANSLUC LEAK PRF ABS ENDOSCP (ENDOSCOPIC SUPPLIES) IMPLANT
SNARE 2.8CM RND 240CM 10MM 2.4MM CAPTIVATR II LOOP STF SHEATH BRD WRE ENDOS PLPCTM STRL DISP (ENDOSCOPIC SUPPLIES) IMPLANT
SNARE ENDOSCOPIC 2.4X10MM 240CM CAPTIVATOR COLD ROUND NONSTRL LF DISP (ENDOSCOPIC SUPPLIES) IMPLANT
WATER STRL 1000ML PLASTIC PR BTL LF (MED SURG SUPPLIES) ×1 IMPLANT

## 2024-08-25 NOTE — Anesthesia Transfer of Care (Signed)
 ANESTHESIA TRANSFER OF CARE   Morgan Evans is a 43 y.o. ,female, Weight: 73.5 kg (162 lb)   had Procedure(s):  COLONOSCOPY  performed  08/25/2024   Primary Service: Aimee Schneiders, MD    Past Medical History:   Diagnosis Date    Arthritis     elbows    Essential hypertension     Pneumonia due to infectious organism 2017      Allergy History as of 08/25/24        No Known Allergies                  I completed my transfer of care / handoff to the receiving personnel during which we discussed:  Access, Airway, All key/critical aspects of case discussed, Analgesia, Antibiotics, Expectation of post procedure, Fluids/Product, Gave opportunity for questions and acknowledgement of understanding, Labs and PMHx                                                                   Last OR Temp: Temperature: 36.5 C (97.7 F)      Airway:* No LDAs found *  Blood pressure 124/78, pulse 78, temperature 36.5 C (97.7 F), resp. rate 13, height 1.753 m (5' 9), weight 73.5 kg (162 lb), last menstrual period 08/11/2024, SpO2 99%.

## 2024-08-25 NOTE — H&P (Signed)
 Pre-Operative H&P    Morgan Evans, 43 y.o. female  Date of Birth:  09/04/1981  Date of service: 08/25/2024  Encounter Start Date: 08/25/2024  Inpatient Admission Date:     Information Obtained from: patient and EHR    Chief Complaint: as per referral and diagnosis on OR case     HPI:    Morgan Evans is a 43 y.o. female who presents for a planned procedure.    Review of Systems:   All pertinent review of systems is as addressed in HPI and chief complaint.    PAST MEDICAL/ FAMILY/ SOCIAL HISTORY:       Past Medical History:   Diagnosis Date    Arthritis     elbows    Essential hypertension     Pneumonia due to infectious organism 2017      Allergies[1]  No current outpatient medications on file.      Past Surgical History:   Procedure Laterality Date    COSMETIC SURGERY  08/2012    MANDIBLE SURGERY  07/2022     Family Medical History:       Problem Relation (Age of Onset)    COPD Father    Diabetes Father             Social History[2]  LR premix infusion, , Intravenous, Continuous      Vital Signs:   BP (!) 143/83   Pulse 87   Temp 36.5 C (97.7 F)   Resp 14   Ht 1.753 m (5' 9)   Wt 73.5 kg (162 lb)   LMP 08/11/2024 (Approximate)   SpO2 98%   BMI 23.92 kg/m       Physical Exam:    General: Vitals Signs are stable, no acute distress, non toxic looking, interactive  Heart:  Regular rate and rhythm without murmur  Lungs:  Clear to auscultation bilaterally no wheeze  Abdomen:  Soft, good bowel sounds, no masses, no organomegaly and nontender  Skin:  No obvious rash    Assessment & Plan:    Patient is a candidate for the procedure and we will proceed as planned.    Aimee Schneiders, MD               [1] No Known Allergies  [2]   Social History  Tobacco Use    Smoking status: Former    Smokeless tobacco: Never   Substance Use Topics    Alcohol use: Yes     Comment: glass of wine a couple times a  week    Drug use: Never

## 2024-08-25 NOTE — Anesthesia Postprocedure Evaluation (Signed)
 Anesthesia Post Op Evaluation    Patient: Morgan Evans  Procedure(s):  COLONOSCOPY    Last Vitals:Temperature: 36.5 C (97.7 F) (08/25/24 0756)  Heart Rate: 78 (08/25/24 0909)  BP (Non-Invasive): 124/78 (08/25/24 0909)  Respiratory Rate: 13 (08/25/24 0909)  SpO2: 99 % (08/25/24 0909)    No notable events documented.    Patient is sufficiently recovered from the effects of anesthesia to participate in the evaluation and has returned to their pre-procedure level.  Patient location during evaluation: PACU       Patient participation: complete - patient participated  Level of consciousness: awake and alert and responsive to verbal stimuli    Pain management: adequate  Airway patency: patent    Anesthetic complications: no  Cardiovascular status: acceptable  Respiratory status: acceptable  Hydration status: acceptable  Patient post-procedure temperature: Pt Normothermic   PONV Status: Absent

## 2024-08-25 NOTE — OR PreOp (Signed)
 RN can lock up belongings while in surgery, patient understands any missing belongings are patients responsibility. Patient instructed to please send any valuable items with family/visitor to ensure no lost or stolen items.

## 2024-08-25 NOTE — Nurses Notes (Signed)

## 2024-08-25 NOTE — Anesthesia Preprocedure Evaluation (Signed)
 ANESTHESIA PRE-OP EVALUATION  Planned Procedure: COLONOSCOPY WITH BIOPSY (Anus)  COLONOSCOPY WITH POLYPECTOMY (Anus)  Review of Systems         patient summary reviewed          Pulmonary   pneumonia,   Cardiovascular    Hypertension ,       GI/Hepatic/Renal           Endo/Other         Neuro/Psych/MS        Cancer                        Physical Assessment      Airway       Mallampati: II    TM distance: 3 FB    Neck ROM: full  Mouth Opening: good.            Dental       Dentition intact             Pulmonary    Breath sounds clear to auscultation       Cardiovascular    Rhythm: regular  Rate: Normal       Other findings              Plan  ASA 2     Planned anesthesia type: MAC           PONV Plan:  I plan to administer pharmcologic prophalaxis antiemetics              Intravenous induction     Anesthesia issues/risks discussed are: PONV, Sore Throat, Difficult Airway and Aspiration.  Anesthetic plan and risks discussed with patient  signed consent obtained          Patient's NPO status is appropriate for Anesthesia.           Plan discussed with CRNA.

## 2024-08-25 NOTE — Discharge Instructions (Signed)
 [facility]  [place], [place] [address]     Anesthesia Discharge Instructions    1.  Do NOT drive an automobile today.    2.  Do NOT sign any legal documents for 24 hours.    3.  Do NOT operate any electrical equipment today.    Fall risk prevention education has been reviewed with the patient/significant other.
